# Patient Record
Sex: Male | Born: 1945 | Marital: Married | Smoking: Former smoker
Health system: Southern US, Community
[De-identification: ages and names within clinical notes are randomized; demographics above are authoritative.]

## PROBLEM LIST (undated history)

## (undated) DIAGNOSIS — I10 Essential (primary) hypertension: Secondary | ICD-10-CM

## (undated) DIAGNOSIS — K449 Diaphragmatic hernia without obstruction or gangrene: Secondary | ICD-10-CM

## (undated) DIAGNOSIS — E669 Obesity, unspecified: Secondary | ICD-10-CM

## (undated) DIAGNOSIS — R270 Ataxia, unspecified: Secondary | ICD-10-CM

## (undated) DIAGNOSIS — F4310 Post-traumatic stress disorder, unspecified: Secondary | ICD-10-CM

## (undated) DIAGNOSIS — E785 Hyperlipidemia, unspecified: Secondary | ICD-10-CM

## (undated) DIAGNOSIS — K219 Gastro-esophageal reflux disease without esophagitis: Secondary | ICD-10-CM

## (undated) HISTORY — DX: Ataxia, unspecified: R27.0

## (undated) HISTORY — PX: HAND SURGERY: SHX662

## (undated) HISTORY — DX: Essential (primary) hypertension: I10

## (undated) HISTORY — DX: Gastro-esophageal reflux disease without esophagitis: K21.9

## (undated) HISTORY — PX: OTHER SURGICAL HISTORY: SHX169

## (undated) HISTORY — DX: Post-traumatic stress disorder, unspecified: F43.10

## (undated) HISTORY — PX: LEG SURGERY: SHX1003

## (undated) HISTORY — DX: Hyperlipidemia, unspecified: E78.5

## (undated) HISTORY — DX: Diaphragmatic hernia without obstruction or gangrene: K44.9

## (undated) HISTORY — DX: Obesity, unspecified: E66.9

## (undated) HISTORY — PX: BACK SURGERY: SHX140

---

## 2004-06-02 ENCOUNTER — Ambulatory Visit: Payer: Self-pay | Admitting: Unknown Physician Specialty

## 2005-08-23 ENCOUNTER — Ambulatory Visit: Payer: Self-pay | Admitting: Internal Medicine

## 2010-05-04 ENCOUNTER — Ambulatory Visit: Payer: Self-pay | Admitting: Otolaryngology

## 2010-05-12 ENCOUNTER — Encounter: Payer: Self-pay | Admitting: Cardiology

## 2010-05-12 ENCOUNTER — Emergency Department: Payer: Self-pay | Admitting: Emergency Medicine

## 2010-05-12 ENCOUNTER — Encounter (INDEPENDENT_AMBULATORY_CARE_PROVIDER_SITE_OTHER): Payer: Medicare Other

## 2010-05-12 ENCOUNTER — Telehealth: Payer: Self-pay | Admitting: Cardiology

## 2010-05-12 DIAGNOSIS — R0602 Shortness of breath: Secondary | ICD-10-CM

## 2010-05-17 ENCOUNTER — Ambulatory Visit (INDEPENDENT_AMBULATORY_CARE_PROVIDER_SITE_OTHER): Payer: Medicare Other | Admitting: Cardiology

## 2010-05-17 ENCOUNTER — Encounter: Payer: Self-pay | Admitting: Cardiology

## 2010-05-17 DIAGNOSIS — R42 Dizziness and giddiness: Secondary | ICD-10-CM

## 2010-05-17 DIAGNOSIS — R079 Chest pain, unspecified: Secondary | ICD-10-CM

## 2010-05-17 DIAGNOSIS — R0789 Other chest pain: Secondary | ICD-10-CM | POA: Insufficient documentation

## 2010-05-17 DIAGNOSIS — E785 Hyperlipidemia, unspecified: Secondary | ICD-10-CM | POA: Insufficient documentation

## 2010-05-18 NOTE — Progress Notes (Signed)
Summary: Sweating, shaking, heavy breathing  Phone Note Call from Patient Call back at [facility] Phone  Call back at [phone]   Caller: Wife Steward Drone) Call For: Ester Mabe Summary of Call: Pt's wife c/o the pt shaking and sweating, breathing heavy. Initial call taken by: Harlon Flor,  May 12, 2010 12:23 PM  Follow-up for Phone Call        Spoke to pt's wife, we do not have any information on pt, he has appt with Dr. Shirlee Latch on Monday 05/17/10. Pt was seeing ENT (Dr. Andee Poles) and was referred here for SOB. Pt's wife states pt has been SOB, diaphoretic, and shaking "all night." Pt is not diabetic. He denies C.P. at this time. Pt does have h/o anxiety and she states he takes medication for this but unsure of name. Advised that pt come in today for nurse visit for EKG and symptom check.  Follow-up by: Lanny Hurst RN,  May 12, 2010 12:46 PM

## 2010-05-18 NOTE — Progress Notes (Signed)
Summary: ED refusal  Phone Note Call from Patient Call back at [phone]   Caller: Wife Steward Drone) Call For: Cheikh Bramble Summary of Call: Pt was in for a nurse visit today and was advised to go the ED.  Pt's wife called stating that there is a 6 hour wait and that the pt has told her that they are going home.  I advised the pt's wife that there is nothing that we can do and that if we told him that he needs to go to the ED, then he needs to stay there.  I explained that if something happens to him at home, no one is going to be able to do anything for him.  Pt's wife would like a call back. Initial call taken by: Harlon Flor,  May 12, 2010 4:28 PM    3  Appended Document: ED refusal Patient hypotensive when he is usually hypertensive, feels "bad," was advised to go to ER given low BP.   Appended Document: ED refusal Per nurse visit append, pt was advised to go to ER, he understands our recommendation, however, he does not want to wait in ER.

## 2010-05-18 NOTE — Assessment & Plan Note (Addendum)
Summary: BP/EKG/ pt SOB/ mes  Nurse Visit   Vital Signs:  Patient profile:   65 year old male Height:      70 inches Weight:      249 pounds BMI:     35.86 Pulse rate:   96 / minute BP sitting:   88 / 62  (left arm)  Vitals Entered By: Bishop Dublin, CMA (May 12, 2010 2:56 PM)   Current Medications (verified): 1)  Omeprazole 20 Mg Cpdr (Omeprazole) .... One Tablet Once Daily 2)  Fish Oil 1000 Mg Caps (Omega-3 Fatty Acids) .... One Tablet Two Times A Day 3)  Lisinopril 20 Mg Tabs (Lisinopril) .... Takes 1/2 Tablet Once Daily 4)  Venlafaxine Hcl 150 Mg Xr24h-Cap (Venlafaxine Hcl) .... One Tablet Two Times A Day 5)  Hydrochlorothiazide 25 Mg Tabs (Hydrochlorothiazide) .... One Tablet Once Daily 6)  Aspirin 81 Mg Tbec (Aspirin) .... One Tablet Once Daily 7)  Terazosin Hcl 2 Mg Caps (Terazosin Hcl) .... Take As Directed  Allergies (verified): No Known Drug Allergies  Visit Type:  Nurse visit  CC:  c/o shoulder and arm & neck pain.  Occas. has chest pain.Marland Kitchen  Appended Document: BP/EKG/ pt SOB/ mes Pt in for nurse visit, c/o SOB, pt is diaphoretic, hypotensive BP 88/62 HR 96. Pt c/o bilateral shoulder and neck pain 6/10. Pt states he is not diabetic. He states last night he did not sleep well and symptoms began yesterday and worse this AM. Pt had f/u scheduled with Dr. Mariah Milling Monday, referred by ENT (Dr. Roney Marion). Pt with h/o hiatal hernia and was going to have GI procedure, but at ov pt was SOB and dizzy after walking up flight of stairs and Dr. Roney Marion wanted pt seen by cardiology first.  Pt states he has had Treadmill  ~2-5 years ago at the [facility] hospital he says was normal. His PCP is at the [place]. No known CAD hx. Pt currently takes Terzosin 2mg  once daily, HCTZ 25mg  once daily, and Lisinopril 10mg  once daily. He took all these meds this AM. Pt states his BP usually runs "high." Pt was sent to ER. Pt's wife later called stating pt did not want to wait at the ER so they went home. Pt and his wife  were advised that pt needed work up at ER due to hypotension and symptoms. Instructed pt to hold BP medications and to monitor BP at home and to contact office with any changes and advised to go to ER if after hours or the weekend and symptoms return.

## 2010-05-19 ENCOUNTER — Encounter: Payer: Self-pay | Admitting: Cardiovascular Disease

## 2010-05-19 ENCOUNTER — Ambulatory Visit: Payer: Self-pay | Admitting: Cardiology

## 2010-05-19 DIAGNOSIS — R079 Chest pain, unspecified: Secondary | ICD-10-CM

## 2010-05-20 ENCOUNTER — Ambulatory Visit (INDEPENDENT_AMBULATORY_CARE_PROVIDER_SITE_OTHER): Payer: Medicare Other | Admitting: *Deleted

## 2010-05-20 DIAGNOSIS — E785 Hyperlipidemia, unspecified: Secondary | ICD-10-CM

## 2010-05-20 LAB — LIPID PANEL
Cholesterol: 220 mg/dL — ABNORMAL HIGH (ref 0–200)
LDL Cholesterol: 144 mg/dL — ABNORMAL HIGH (ref 0–99)
Total CHOL/HDL Ratio: 7.1 Ratio
Triglycerides: 225 mg/dL — ABNORMAL HIGH (ref <150)
VLDL: 45 mg/dL — ABNORMAL HIGH (ref 0–40)

## 2010-05-20 LAB — HEPATIC FUNCTION PANEL
ALT: 25 U/L (ref 0–53)
Albumin: 4.6 g/dL (ref 3.5–5.2)
Indirect Bilirubin: 0.6 mg/dL (ref 0.0–0.9)
Total Protein: 7.2 g/dL (ref 6.0–8.3)

## 2010-05-21 ENCOUNTER — Telehealth: Payer: Self-pay | Admitting: *Deleted

## 2010-05-21 NOTE — Telephone Encounter (Signed)
Spoke to pt's wife, notified her that myoview was normal per Dr. Mariah Milling. Pt has echo and f/u with Dr. Shirlee Latch 05/2010.

## 2010-05-27 ENCOUNTER — Ambulatory Visit: Payer: Self-pay | Admitting: Cardiovascular Disease
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
## 2010-05-27 NOTE — Letter (Signed)
Summary: Radio broadcast assistant at [address]   [place], [place] [address]   Phone: [phone]  Fax: [phone]      [facility] MYOVIEW  Patient Name:  TRAPPER MEECH          MRN:  [id number]  DOB:  Dec 27, 1945             Weight:  244.50 Home Telephone:  [phone]           Work Phone:   Referring Physician:   __X___ Gated Myoview  _____ Lexi Scan  Your procedure is scheduled for 05/19/10 @ [facility]. Please arrive at Registration at the [facility] @ 8:15am. Instructions regarding medication: HOLD HCTZ AM prior to procedure.    PLEASE NOTIFY THE OFFICE AT LEAST 24 HOURS IN ADVANCE IF YOU ARE UNABLE TO KEEP YOUR APPOINTMENT.  [phone]   How to prepare for your Myoview test:  1)   Do not eat or drink 4 hours prior to arrival time. 2)   No caffeine or decaffeinated 12 hours prior to arrival. 3)   Your medication may be taken with water.  If your doctor stopped a        medicaiton because of this test, do not take that medication. 4)   Ladies, please do not wear dresses.  Skirts or pants are appropriate.       Please wear a short sleeve shirt. 5)   No perfume, cologne or lotion. 6)   Wear comfortable walking shoes.  No heels!    PLEASE REPORT TO [facility] MEDICAL MALL ENTRANCE. THE VOLUNTEERS AT THE FIRST DESK WILL DIRECT YOU WHERE TO GO.

## 2010-06-02 ENCOUNTER — Other Ambulatory Visit: Payer: Self-pay | Admitting: Cardiology

## 2010-06-02 DIAGNOSIS — L97509 Non-pressure chronic ulcer of other part of unspecified foot with unspecified severity: Secondary | ICD-10-CM

## 2010-06-03 ENCOUNTER — Other Ambulatory Visit (INDEPENDENT_AMBULATORY_CARE_PROVIDER_SITE_OTHER): Payer: Medicare Other | Admitting: *Deleted

## 2010-06-03 DIAGNOSIS — R072 Precordial pain: Secondary | ICD-10-CM

## 2010-06-11 ENCOUNTER — Ambulatory Visit (INDEPENDENT_AMBULATORY_CARE_PROVIDER_SITE_OTHER): Payer: Medicare Other | Admitting: Cardiology

## 2010-06-11 ENCOUNTER — Encounter: Payer: Self-pay | Admitting: Cardiology

## 2010-06-11 ENCOUNTER — Ambulatory Visit: Payer: Self-pay | Admitting: Otolaryngology

## 2010-06-11 DIAGNOSIS — R0789 Other chest pain: Secondary | ICD-10-CM

## 2010-06-11 DIAGNOSIS — R002 Palpitations: Secondary | ICD-10-CM

## 2010-06-11 DIAGNOSIS — R42 Dizziness and giddiness: Secondary | ICD-10-CM

## 2010-06-11 DIAGNOSIS — E785 Hyperlipidemia, unspecified: Secondary | ICD-10-CM

## 2010-06-11 DIAGNOSIS — R079 Chest pain, unspecified: Secondary | ICD-10-CM

## 2010-06-11 MED ORDER — SIMVASTATIN 20 MG PO TABS: 20.0000 mg | ORAL_TABLET | Freq: Every evening | ORAL | Status: DC

## 2010-06-11 NOTE — Patient Instructions (Signed)
Start Zocor 20mg  once daily. Your physician recommends that you return for a FASTING lipid profile: 2 months Your physician recommends that you schedule a follow-up appointment in: 6 months

## 2010-06-13 DIAGNOSIS — R42 Dizziness and giddiness: Secondary | ICD-10-CM | POA: Insufficient documentation

## 2010-06-13 NOTE — Assessment & Plan Note (Signed)
Patient has not had any more chest pain since last appointment.  The pain he had was worrisome, however, given his risk factors of hyperlipidemia, HTN and family history of CAD.  ETT-myoview, however, showed good exercise tolerance and no evidence for ischemia or infarction.  Continue ASA 81 mg daily.

## 2010-06-13 NOTE — Progress Notes (Signed)
Patient ID: Charles Cummings, male    DOB: February 14, 1946, 65 y.o.   MRN: [id number]  PCP: Dr. Selena Batten, [facility]  HPI  65 yo with history of HTN and hyperlipidemia presented initially for evaluation of dizziness and chest pain.  Over several weeks prior to last appointment, patient had 4-5 episodes of chest pain.  He felt tightness in his left chest radiating to the back.  This tended to occur when he first woke up in the morning.  It lasted for about 1 minute and then resolved when he took an aspirin.  He actually has not had any chest pain since the last appointment.  He has chronic dyspnea walking up steps or walking up a hill.  He additionally has been having "dizzy spells."  He has noted this especially when climbing steps: he will be lightheaded at the top.  However, lightheadedness does not always occur when he is exerting himself.  Sometimes he notes the "dizziness" when he turns his head a particular way. No tachypalpitations.    Patient does not smoke.  He does have a strong family history of premature CAD (mother in her early 71s, brother with CABG at 50).    I had him do an ETT-myoview.  He had good exercise tolerance and no evidence for ischemia or infarction.  Echo was done, showing normal LV systolic function as well as no significant valvular abnormalities.   Labs (3/12): LDL 144, HDL 31, TGs 225  Allergies (verified):  No Known Drug Allergies  Past Medical History: 1. Hyperlipidemia: rash when taking statin (not sure which one) 2. Hypertension 3. PTSD 4. Obesity 5. GERD/hiatal hernia 6. Chest pain: ETT-myoview (3/12) 9'31" exercise, no chest pain, EF 57%, no evidence for ischemia or infarction.  7. Dizziness/lightheadedness: Echo (3/12) EF 55-60%, grade I diastolic dysfunction, normal RV size and systolic function, no significant valvular abnormalities.   Family History: Father:deceased-pneumonia Mother: CAD- deceased-MI (late 79s) Brother-CABG at 25  Social  History: Retired from telephone company Married, lives in [place] Tobacco Use - Former-quit 1980 Alcohol Use - no Regular Exercise - no Drug Use - no Smoking Status:  quit Does Patient Exercise:  no Drug Use:  no  Review of Systems  All systems reviewed and negative except as per HPI.   Current Outpatient Prescriptions  Medication Sig Dispense Refill  . aspirin 81 MG tablet Take 81 mg by mouth daily.        . hydrochlorothiazide 25 MG tablet Take 25 mg by mouth daily.        Marland Kitchen lisinopril (PRINIVIL,ZESTRIL) 20 MG tablet Take 1/2 tablet daily.       . Omega-3 Fatty Acids (FISH OIL) 1000 MG CAPS Take 1,000 mg by mouth 2 (two) times daily.        Marland Kitchen omeprazole (PRILOSEC) 20 MG capsule Take 20 mg by mouth daily.        Marland Kitchen venlafaxine (EFFEXOR-XR) 150 MG 24 hr capsule Take 150 mg by mouth 2 (two) times daily.        . simvastatin (ZOCOR) 20 MG tablet Take 1 tablet (20 mg total) by mouth every evening.  30 tablet  6  . terazosin (HYTRIN) 2 MG capsule Take 2 mg by mouth at bedtime. Take as directed         Physical Exam BP 138/78  Pulse 80  Ht 5\' 10"  (1.778 m)  Wt 244 lb (110.678 kg)  BMI 35.01 kg/m2 General: NAD, overweight Neck: No JVD, no  thyromegaly or thyroid nodule.  Lungs: Clear to auscultation bilaterally with normal respiratory effort. CV: Nondisplaced PMI.  Heart regular S1/S2, no S3/S4, no murmur.  No peripheral edema.  No carotid bruit.  Normal pedal pulses.  Abdomen: Soft, nontender, no hepatosplenomegaly, no distention.  Neurologic: Alert and oriented x 3.  Psych: Normal affect. Extremities: No clubbing or cyanosis.

## 2010-06-13 NOTE — Assessment & Plan Note (Signed)
Patient's dizzy spells have some features that could be consistent with positional vertigo but do not fully fit this diagnosis.  He gets them on most days.  I will get a 48 hour holter monitor to look for any arrhythmia that could contribute.

## 2010-06-13 NOTE — Assessment & Plan Note (Signed)
LDL is high.  Given risk factors for CAD, I am going to go ahead and start him on simvastatin 20 mg daily.  Lipids/LFTs in 2 months.

## 2010-06-17 ENCOUNTER — Encounter: Payer: Self-pay | Admitting: Cardiology

## 2010-07-13 ENCOUNTER — Telehealth: Payer: Self-pay | Admitting: *Deleted

## 2010-07-13 NOTE — Telephone Encounter (Signed)
Pt's wife called stating pt had previously discussed having heart cath at [facility], but has recently changed his mind and wants it to be done here. Pt continues to have dizziness and SOB per pt's wife. Pt last seen by Dr. Shirlee Latch 06/11/10, he did have normal stress test, and dizziness thought to be possibly from vertigo. Did not see anywhere discussed about having cath, do you want me to schedule? Please advise.

## 2010-07-14 NOTE — Telephone Encounter (Signed)
Spoke to pt, notified of msg, scheduled pt for this Friday 5/18 with Dr. Shirlee Latch.

## 2010-07-14 NOTE — Telephone Encounter (Signed)
Probably should see him in the office to discuss this.  He did have a normal myoview.  He was supposed to have a holter to evaluate the dizziness, which I thought was vertigo.  If he is symptomatically more short of breath, can discuss cath.

## 2010-07-16 ENCOUNTER — Ambulatory Visit (INDEPENDENT_AMBULATORY_CARE_PROVIDER_SITE_OTHER): Payer: Medicare Other | Admitting: Cardiology

## 2010-07-16 ENCOUNTER — Encounter: Payer: Self-pay | Admitting: *Deleted

## 2010-07-16 ENCOUNTER — Encounter: Payer: Self-pay | Admitting: Cardiology

## 2010-07-16 DIAGNOSIS — I10 Essential (primary) hypertension: Secondary | ICD-10-CM

## 2010-07-16 DIAGNOSIS — R079 Chest pain, unspecified: Secondary | ICD-10-CM

## 2010-07-16 DIAGNOSIS — R0789 Other chest pain: Secondary | ICD-10-CM

## 2010-07-16 DIAGNOSIS — Z01811 Encounter for preprocedural respiratory examination: Secondary | ICD-10-CM

## 2010-07-16 DIAGNOSIS — E7800 Pure hypercholesterolemia, unspecified: Secondary | ICD-10-CM

## 2010-07-16 DIAGNOSIS — E785 Hyperlipidemia, unspecified: Secondary | ICD-10-CM

## 2010-07-16 DIAGNOSIS — Z01818 Encounter for other preprocedural examination: Secondary | ICD-10-CM

## 2010-07-16 LAB — CBC WITH DIFFERENTIAL/PLATELET
Basophils Absolute: 0.1 10*3/uL (ref 0.0–0.1)
Lymphocytes Relative: 32 % (ref 12–46)
Lymphs Abs: 2.9 10*3/uL (ref 0.7–4.0)
Neutro Abs: 5.3 10*3/uL (ref 1.7–7.7)
Neutrophils Relative %: 57 % (ref 43–77)
Platelets: 242 10*3/uL (ref 150–400)
RBC: 5.03 MIL/uL (ref 4.22–5.81)
RDW: 13.5 % (ref 11.5–15.5)
WBC: 9.2 10*3/uL (ref 4.0–10.5)

## 2010-07-16 LAB — PROTIME-INR: INR: 0.97 (ref <1.50)

## 2010-07-16 LAB — APTT: aPTT: 29 seconds (ref 24–37)

## 2010-07-16 LAB — BASIC METABOLIC PANEL
CO2: 23 mEq/L (ref 19–32)
Chloride: 103 mEq/L (ref 96–112)
Creat: 0.79 mg/dL (ref 0.40–1.50)
Sodium: 141 mEq/L (ref 135–145)

## 2010-07-16 NOTE — Patient Instructions (Signed)
Your physician has requested that you have a cardiac catheterization. Cardiac catheterization is used to diagnose and/or treat various heart conditions. Doctors may recommend this procedure for a number of different reasons. The most common reason is to evaluate chest pain. Chest pain can be a symptom of coronary artery disease (CAD), and cardiac catheterization can show whether plaque is narrowing or blocking your heart's arteries. This procedure is also used to evaluate the valves, as well as measure the blood flow and oxygen levels in different parts of your heart. For further information please visit [email or web address] Please follow instruction sheet, as given.  Your physician recommends that you return for a FASTING lipid profile: Lipid/Lft in June 2012:

## 2010-07-18 DIAGNOSIS — I10 Essential (primary) hypertension: Secondary | ICD-10-CM | POA: Insufficient documentation

## 2010-07-18 NOTE — Progress Notes (Signed)
PCP: Dr. Selena Batten, [facility]   65 yo with history of HTN and hyperlipidemia presented initially for evaluation of dizziness and chest pain. Over several weeks prior to initial appointment, patient had 4-5 episodes of chest pain. He felt tightness in his left chest radiating to the back. This tended to occur when he first woke up in the morning. It lasted for about 1 minute and then resolved when he took an aspirin.  He has chronic dyspnea walking up steps or walking up a hill. He additionally has been having "dizzy spells." He has noted this especially when climbing steps: he will be lightheaded at the top. However, lightheadedness does not always occur when he is exerting himself. Sometimes he notes the "dizziness" when he turns his head a particular way. No tachypalpitations.   Patient does not smoke. He does have a strong family history of premature CAD (mother in her early 43s, brother with CABG at 25). I had him do an ETT-myoview. He had good exercise tolerance and no evidence for ischemia or infarction. Echo was done, showing normal LV systolic function as well as no significant valvular abnormalities.   Since last appointment, patient had another episode of severe chest pain 10-14 days ago while walking around in his house.  Pain radiated to his neck. It lasted about 30 minutes.  He went to the [facility] for evaluation, but chest pain had resolved by the time he got there.  His ECG was unremarkable and cardiac enzymes were negative.  He has not had any further chest pain but continues to get dyspnea with heavier exertion like yardwork and climbing steps.  He still notes "dizziness" with exertion.  BP is 100/80 today and has been running on the low side at home.   Holter monitor was done given history of dizziness.  This was relatively unremarkable, showing only PACs.   Labs (3/12): LDL 144, HDL 31, TGs 225   ECG: NSR, normal  Allergies (verified):  No Known Drug Allergies   Past Medical History:    1. Hyperlipidemia: rash when taking statin (not sure which one)  2. Hypertension  3. PTSD  4. Obesity  5. GERD/hiatal hernia  6. Chest pain: ETT-myoview (3/12) 9'31" exercise, no chest pain, EF 57%, no evidence for ischemia or infarction.  7. Dizziness/lightheadedness: Echo (3/12) EF 55-60%, grade I diastolic dysfunction, normal RV size and systolic function, no significant valvular abnormalities.  Holter (5/12): HR 68-115 sinus, average 82, no significant arrhythmias, occasional PACs.   Family History:  Father:deceased-pneumonia  Mother: CAD- deceased-MI (late 6s)  Brother-CABG at 68   Social History:  Retired from telephone company  Married, lives in [place]  Tobacco Use - Former-quit 1980  Alcohol Use - no  Regular Exercise - no  Drug Use - no  Smoking Status: quit  Does Patient Exercise: no  Drug Use: no   Review of Systems:  All systems reviewed and negative except as per HPI.   Current Outpatient Prescriptions  Medication Sig Dispense Refill  . aspirin 81 MG tablet Take 81 mg by mouth daily.        . hydrochlorothiazide 25 MG tablet Take 25 mg by mouth daily.        Marland Kitchen lisinopril (PRINIVIL,ZESTRIL) 20 MG tablet Take 1/2 tablet daily.       . Omega-3 Fatty Acids (FISH OIL) 1000 MG CAPS Take 1,000 mg by mouth 2 (two) times daily.        Marland Kitchen omeprazole (PRILOSEC) 20 MG  capsule Take 20 mg by mouth daily.        . simvastatin (ZOCOR) 20 MG tablet Take 1 tablet (20 mg total) by mouth every evening.  30 tablet  6  . terazosin (HYTRIN) 2 MG capsule Take 2 mg by mouth at bedtime. Take as directed       . venlafaxine (EFFEXOR-XR) 150 MG 24 hr capsule Take 150 mg by mouth 2 (two) times daily.          BP 100/80  Pulse 78  Ht 5\' 10"  (1.778 m)  Wt 245 lb (111.131 kg)  BMI 35.15 kg/m2 General: NAD, overweight  Neck: No JVD, no thyromegaly or thyroid nodule.  Lungs: Clear to auscultation bilaterally with normal respiratory effort.  CV: Nondisplaced PMI. Heart regular S1/S2,  no S3/S4, no murmur. No peripheral edema. No carotid bruit. Normal pedal pulses.  Abdomen: Soft, nontender, no hepatosplenomegaly, no distention.  Neurologic: Alert and oriented x 3.  Psych: Normal affect.  Extremities: No clubbing or cyanosis.

## 2010-07-18 NOTE — Assessment & Plan Note (Signed)
BP is running on the low side.  I will have him stop HCTZ.  We will follow his BP closely.

## 2010-07-18 NOTE — Assessment & Plan Note (Addendum)
Patient continues to have episodes of significant chest pain as well as exertional dyspnea.  He has multiple CAD risk factors.  Even though he has had a normal myoview, his ongoing symptoms are worrisome.  He went to the ER at the [place] with chest pain about 2 weeks ago.  We had a long discussion about further steps and settled on left heart catheterization, which will be done next.  We discussed the procedure and risks (02/998 chance for severe complications).  Patient will continue ASA, simvastatin, ACEI.   He is also getting an EGD at [facility] to workup for possible GI causes of his symptoms.

## 2010-07-18 NOTE — Assessment & Plan Note (Signed)
Lipids/LFTs on simvastatin in 6/12.  If CAD is found on cath, goal LDL will be < 70.

## 2010-07-20 ENCOUNTER — Ambulatory Visit (HOSPITAL_BASED_OUTPATIENT_CLINIC_OR_DEPARTMENT_OTHER)
Admission: RE | Admit: 2010-07-20 | Discharge: 2010-07-20 | Disposition: A | Discharge status: Home / Self Care | Payer: Medicare Other | Source: Ambulatory Visit | Attending: Cardiology | Admitting: Cardiology

## 2010-07-20 DIAGNOSIS — K219 Gastro-esophageal reflux disease without esophagitis: Secondary | ICD-10-CM | POA: Insufficient documentation

## 2010-07-20 DIAGNOSIS — Z8249 Family history of ischemic heart disease and other diseases of the circulatory system: Secondary | ICD-10-CM | POA: Insufficient documentation

## 2010-07-20 DIAGNOSIS — I2510 Atherosclerotic heart disease of native coronary artery without angina pectoris: Secondary | ICD-10-CM

## 2010-07-20 DIAGNOSIS — Z7982 Long term (current) use of aspirin: Secondary | ICD-10-CM | POA: Insufficient documentation

## 2010-07-20 DIAGNOSIS — R079 Chest pain, unspecified: Secondary | ICD-10-CM | POA: Insufficient documentation

## 2010-07-20 DIAGNOSIS — Z79899 Other long term (current) drug therapy: Secondary | ICD-10-CM | POA: Insufficient documentation

## 2010-07-20 DIAGNOSIS — E785 Hyperlipidemia, unspecified: Secondary | ICD-10-CM | POA: Insufficient documentation

## 2010-07-20 DIAGNOSIS — I10 Essential (primary) hypertension: Secondary | ICD-10-CM | POA: Insufficient documentation

## 2010-07-26 NOTE — Cardiovascular Report (Signed)
  NAME:  AIKEEM, LILLEY NO.:  [id number]  MEDICAL RECORD NO.:  [id number]           PATIENT TYPE:  O  LOCATION:  [facility]                         FACILITY:  [facility]  PHYSICIAN:  Marca Ancona, MD      DATE OF BIRTH:  [birth year]  DATE OF PROCEDURE:  07/20/2010 DATE OF DISCHARGE:                           CARDIAC CATHETERIZATION   PROCEDURES: 1. Left heart catheterization. 2. Coronary angiography.  INDICATIONS:  This is a 65 year old who has had ongoing chest pain episodes despite having a negative Myoview.  He was in the emergency department in [place], [place], with chest pain a week or 2 ago and at that time they suggested he have a heart catheterization done.  I thought this is reasonable, so I am taking him today for outpatient left heart catheterization.  PROCEDURE NOTE:  After informed consent was obtained, the patient underwent Allen testing on his right wrist.  The right ulnar artery provided good collateral circulation to the radial side of the hand, constituting a positive test.  The right radial area was sterilely prepped and draped.  A 1% lidocaine was used to locally anesthetize the right radial area.  The right radial artery was entered using modified Seldinger technique and a 5-French arterial sheath was placed.  The patient received 3 mg of intraarterial verapamil and 4000 units of IV heparin.  The right coronary artery was then engaged using the JR-4 catheter.  The left coronary artery was engaged using the JL-3.5 catheter and the left ventricle was entered using angled pigtail catheter.  There were no complications.  FINDINGS: 1. Hemodynamics:  LV 135/25, aorta 139/80. 2. Left ventriculography.  Left ventriculography was not done.  The     patient has had recent echo and Myoview, both showing normal     systolic function and wall motion. 3. Right coronary artery.  The right coronary artery was dominant     vessel with luminal irregularities only. 4.  Left main.  The left main had no significant disease. 5. Left circumflex system.  There was a large ramus intermedius with     luminal irregularities.  The circumflex itself was relatively small     and gave rise to a small first obtuse marginal.  There were luminal     irregularities only. 6. LAD system.  There was a small-to-moderate first diagonal with     about 30% proximal stenosis.  The proximal LAD itself at the site     of the first diagonal had about 30% stenosis.  The remainder of the     LAD had mild luminal irregularities.  IMPRESSION:  The patient had nonobstructive coronary artery disease.  I do question whether his symptoms could be gastrointestinal related.  He will set up for an EGD on Thursday.     Marca Ancona, MD     DM/MEDQ  D:  07/20/2010  T:  07/20/2010  Job:  [id number]  cc:   Dr. Selena Batten  Electronically Signed by Marca Ancona MD on 07/26/2010 08:35:14 AM

## 2010-07-29 ENCOUNTER — Ambulatory Visit: Payer: Self-pay | Admitting: Gastroenterology

## 2010-07-29 ENCOUNTER — Encounter (INDEPENDENT_AMBULATORY_CARE_PROVIDER_SITE_OTHER): Payer: Medicare Other | Admitting: *Deleted

## 2010-07-29 ENCOUNTER — Other Ambulatory Visit: Payer: Self-pay | Admitting: Cardiology

## 2010-07-29 DIAGNOSIS — R42 Dizziness and giddiness: Secondary | ICD-10-CM

## 2010-07-29 DIAGNOSIS — R0789 Other chest pain: Secondary | ICD-10-CM

## 2010-08-02 ENCOUNTER — Encounter: Payer: Self-pay | Admitting: Cardiology

## 2010-08-03 ENCOUNTER — Other Ambulatory Visit (INDEPENDENT_AMBULATORY_CARE_PROVIDER_SITE_OTHER): Payer: Medicare Other | Admitting: *Deleted

## 2010-08-03 DIAGNOSIS — E7800 Pure hypercholesterolemia, unspecified: Secondary | ICD-10-CM

## 2010-08-03 LAB — HEPATIC FUNCTION PANEL
ALT: 20 U/L (ref 0–53)
AST: 20 U/L (ref 0–37)
Albumin: 4.6 g/dL (ref 3.5–5.2)
Alkaline Phosphatase: 58 U/L (ref 39–117)
Bilirubin, Direct: 0.1 mg/dL (ref 0.0–0.3)
Total Bilirubin: 0.5 mg/dL (ref 0.3–1.2)

## 2010-08-03 LAB — LIPID PANEL
Cholesterol: 166 mg/dL (ref 0–200)
HDL: 33 mg/dL — ABNORMAL LOW (ref >39)
Total CHOL/HDL Ratio: 5 Ratio

## 2011-05-11 ENCOUNTER — Ambulatory Visit: Payer: Self-pay | Admitting: Urology

## 2011-05-11 LAB — HEMOGLOBIN: HGB: 15.9 g/dL (ref 13.0–18.0)

## 2011-05-11 LAB — POTASSIUM: Potassium: 4.2 mmol/L (ref 3.5–5.1)

## 2011-05-12 LAB — URINE CULTURE

## 2011-05-18 ENCOUNTER — Ambulatory Visit: Payer: Self-pay | Admitting: Urology

## 2011-09-26 ENCOUNTER — Ambulatory Visit: Payer: Self-pay | Admitting: Otolaryngology

## 2011-09-26 LAB — CREATININE, SERUM: EGFR (Non-African Amer.): >60

## 2012-08-13 ENCOUNTER — Encounter: Payer: Self-pay | Admitting: *Deleted

## 2012-08-14 ENCOUNTER — Encounter: Payer: Self-pay | Admitting: Cardiology

## 2012-08-14 ENCOUNTER — Ambulatory Visit (INDEPENDENT_AMBULATORY_CARE_PROVIDER_SITE_OTHER): Payer: Medicare Other | Admitting: Cardiology

## 2012-08-14 VITALS — BP 128/72 | HR 83 | Ht 70.0 in | Wt 248.0 lb

## 2012-08-14 DIAGNOSIS — R42 Dizziness and giddiness: Secondary | ICD-10-CM

## 2012-08-14 DIAGNOSIS — E785 Hyperlipidemia, unspecified: Secondary | ICD-10-CM

## 2012-08-14 DIAGNOSIS — R0789 Other chest pain: Secondary | ICD-10-CM

## 2012-08-14 DIAGNOSIS — I10 Essential (primary) hypertension: Secondary | ICD-10-CM

## 2012-08-14 NOTE — Patient Instructions (Addendum)
Your physician has requested that you have an echocardiogram. Echocardiography is a painless test that uses sound waves to create images of your heart. It provides your doctor with information about the size and shape of your heart and how well your heart's chambers and valves are working. This procedure takes approximately one hour. There are no restrictions for this procedure.  Your physician recommends that you return for a FASTING lipid profile when you have the echocardiogram.    Your physician wants you to follow-up in: 1 year with Dr Shirlee Latch. (June 2015).  You will receive a reminder letter in the mail two months in advance. If you don't receive a letter, please call our office to schedule the follow-up appointment.

## 2012-08-15 ENCOUNTER — Other Ambulatory Visit (HOSPITAL_COMMUNITY): Payer: Medicare Other

## 2012-08-16 ENCOUNTER — Other Ambulatory Visit (HOSPITAL_COMMUNITY): Payer: Self-pay | Admitting: Cardiology

## 2012-08-16 ENCOUNTER — Other Ambulatory Visit (INDEPENDENT_AMBULATORY_CARE_PROVIDER_SITE_OTHER): Payer: Medicare Other

## 2012-08-16 ENCOUNTER — Ambulatory Visit (HOSPITAL_COMMUNITY): Payer: Medicare Other | Attending: Cardiology | Admitting: Radiology

## 2012-08-16 DIAGNOSIS — R42 Dizziness and giddiness: Secondary | ICD-10-CM | POA: Insufficient documentation

## 2012-08-16 DIAGNOSIS — E785 Hyperlipidemia, unspecified: Secondary | ICD-10-CM

## 2012-08-16 DIAGNOSIS — R0602 Shortness of breath: Secondary | ICD-10-CM

## 2012-08-16 LAB — LIPID PANEL
Cholesterol: 191 mg/dL (ref 0–200)
HDL: 28 mg/dL — ABNORMAL LOW (ref >39.00)
Total CHOL/HDL Ratio: 7
Triglycerides: 282 mg/dL — ABNORMAL HIGH (ref 0.0–149.0)
VLDL: 56.4 mg/dL — ABNORMAL HIGH (ref 0.0–40.0)

## 2012-08-16 LAB — LDL CHOLESTEROL, DIRECT: Direct LDL: 115.6 mg/dL

## 2012-08-16 MED ORDER — PERFLUTREN PROTEIN A MICROSPH IV SUSP
3.0000 mL | Freq: Once | INTRAVENOUS | Status: AC
Start: 2012-08-16 — End: 2012-08-16
  Administered 2012-08-16: 3 mL via INTRAVENOUS

## 2012-08-16 NOTE — Addendum Note (Signed)
Addended by: Micki Riley C on: 08/16/2012 11:47 AM   Modules accepted: Orders

## 2012-08-16 NOTE — Progress Notes (Signed)
Patient ID: Charles Cummings, male   DOB: [birth year], 67 y.o.   MRN: [id number] PCP: Dr. Selena Batten, [facility]   67 yo with history of HTN and hyperlipidemia returns to the office for preoperative cardiac evaluation.  He needs TKR (next Tuesday).  Patient does not smoke. He does have a strong family history of premature CAD (mother in her early 30s, brother with CABG at 22). I had him do an ETT-myoview in 3/12. He had good exercise tolerance and no evidence for ischemia or infarction. Echo was done in 3/12, showing normal LV systolic function as well as no significant valvular abnormalities.  He continued to have chest pain episodes so had LHC in 5/12 that showed nonobstructive CAD.   He had one episode of lightheadedness recently with urinating => felt warm, then lightheaded.  He is having to strain to urinate due to BPH.  He has had lightheaded spells with straining to urinate in the past as well.  No chest pain.  Main limitation at this point is knee pain.  He can go up a flight of steps but it is difficult because of the knee pain.  No exertional dyspnea (though not very active).  No tachypalpitations.   Labs (3/12): LDL 144, HDL 31, TGs 225   ECG: NSR, normal  Allergies (verified):  No Known Drug Allergies   Past Medical History:  1. Hyperlipidemia: rash when taking statin (not sure which one)  2. Hypertension  3. PTSD  4. Obesity  5. GERD/hiatal hernia  6. Chest pain: ETT-myoview (3/12) 9'31" exercise, no chest pain, EF 57%, no evidence for ischemia or infarction.  LHC (5/12) with nonobstructive disease.  7. Dizziness/lightheadedness: Echo (3/12) EF 55-60%, grade I diastolic dysfunction, normal RV size and systolic function, no significant valvular abnormalities.  Holter (5/12): HR 68-115 sinus, average 82, no significant arrhythmias, occasional PACs.  8. Osteoarthritis  Family History:  Father:deceased-pneumonia  Mother: CAD- deceased-MI (late 7s)  Brother-CABG at 55   Social  History:  Retired from telephone company  Married, lives in [place]  Tobacco Use - Former-quit 1980  Alcohol Use - no  Regular Exercise - no  Drug Use - no  Smoking Status: quit  Does Patient Exercise: no  Drug Use: no   Review of Systems:  All systems reviewed and negative except as per HPI.   Current Outpatient Prescriptions  Medication Sig Dispense Refill  . aspirin 81 MG tablet Take 81 mg by mouth daily.        . hydrochlorothiazide 25 MG tablet Take 25 mg by mouth daily.        Marland Kitchen lisinopril (PRINIVIL,ZESTRIL) 20 MG tablet Take 1/2 tablet daily.       . Omega-3 Fatty Acids (FISH OIL) 1000 MG CAPS Take 1,000 mg by mouth 2 (two) times daily.        Marland Kitchen omeprazole (PRILOSEC) 20 MG capsule Take 20 mg by mouth daily.        . simvastatin (ZOCOR) 20 MG tablet Take 1 tablet (20 mg total) by mouth every evening.  30 tablet  6  . terazosin (HYTRIN) 2 MG capsule Take 2 mg by mouth at bedtime. Take as directed       . venlafaxine (EFFEXOR-XR) 150 MG 24 hr capsule Take 150 mg by mouth 2 (two) times daily.         No current facility-administered medications for this visit.    BP 128/72  Pulse 83  Ht 5\' 10"  (1.778 m)  Wt 248 lb (112.492 kg)  BMI 35.58 kg/m2 General: NAD, overweight  Neck: No JVD, no thyromegaly or thyroid nodule.  Lungs: Clear to auscultation bilaterally with normal respiratory effort.  CV: Nondisplaced PMI. Heart regular S1/S2, no S3/S4, no murmur. No peripheral edema. No carotid bruit. Normal pedal pulses.  Abdomen: Soft, nontender, no hepatosplenomegaly, no distention.  Neurologic: Alert and oriented x 3.  Psych: Normal affect.  Extremities: No clubbing or cyanosis.  Assessment/Plan: 1. Chest pain: He has not had significant chest pain recently.  LHC in 5/12 showed nonobstructive CAD.  He is on ASA 81 and statin.  2. Presyncope: Patient has had periodic presyncopal spells when he strains to urinate.  I suspect that these are vasovagal responses.  As he is  planning to have knee surgery soon, I will get an echocardiogram to make sure that heart remains structurally normal.  Ultimately, the treatment for this is going to be to correct his BPH so he does not strain with urination.  3. Hyperlipidemia: Check lipids today.  4. Preoperative evaluation: I think that patient can proceed with surgery without a stress test.  Had nonobstructive disease on LHC in 5/12.  As above, getting echo with presyncopal spells.   Marca Ancona 08/16/2012 10:01 AM

## 2012-08-16 NOTE — Progress Notes (Signed)
Echocardiogram performed with [employer].  

## 2012-08-20 ENCOUNTER — Other Ambulatory Visit: Payer: Self-pay | Admitting: *Deleted

## 2012-08-20 DIAGNOSIS — E785 Hyperlipidemia, unspecified: Secondary | ICD-10-CM

## 2012-08-20 MED ORDER — SIMVASTATIN 40 MG PO TABS: 40.0000 mg | ORAL_TABLET | Freq: Every day | ORAL | Status: AC

## 2012-10-16 ENCOUNTER — Other Ambulatory Visit (INDEPENDENT_AMBULATORY_CARE_PROVIDER_SITE_OTHER): Payer: Medicare Other

## 2012-10-16 DIAGNOSIS — E785 Hyperlipidemia, unspecified: Secondary | ICD-10-CM

## 2012-10-16 LAB — HEPATIC FUNCTION PANEL
ALT: 14 U/L (ref 0–53)
AST: 21 U/L (ref 0–37)
Alkaline Phosphatase: 69 U/L (ref 39–117)
Bilirubin, Direct: 0 mg/dL (ref 0.0–0.3)
Total Bilirubin: 0.5 mg/dL (ref 0.3–1.2)

## 2012-10-16 LAB — LIPID PANEL: HDL: 26.7 mg/dL — ABNORMAL LOW (ref >39.00)

## 2012-10-22 ENCOUNTER — Telehealth: Payer: Self-pay

## 2012-10-22 NOTE — Telephone Encounter (Signed)
Message copied by Charline Bills on Mon Oct 22, 2012  4:42 PM ------      Message from: Laurey Morale      Created: Fri Oct 19, 2012  9:37 AM       LDL is ok ------

## 2012-10-22 NOTE — Telephone Encounter (Signed)
Message copied by Charline Bills on Mon Oct 22, 2012  4:47 PM ------      Message from: Laurey Morale      Created: Fri Oct 19, 2012  9:37 AM       LDL is ok ------

## 2012-10-22 NOTE — Telephone Encounter (Signed)
Spoke with this patient about his recent lab work and he verbalized understanding. Patient was counseled on a low cholestrol diet and to increase in exercise as he could.

## 2012-10-24 NOTE — Telephone Encounter (Signed)
Spoke with this patient about his lab work and stressed the importance of a low cholesterol diet combined with exercise.

## 2013-06-06 ENCOUNTER — Ambulatory Visit: Payer: Self-pay | Admitting: Neurology

## 2013-07-19 ENCOUNTER — Encounter: Payer: Self-pay | Admitting: Cardiology

## 2013-08-22 DIAGNOSIS — R471 Dysarthria and anarthria: Secondary | ICD-10-CM | POA: Insufficient documentation

## 2013-08-22 DIAGNOSIS — G629 Polyneuropathy, unspecified: Secondary | ICD-10-CM | POA: Insufficient documentation

## 2013-08-22 DIAGNOSIS — A5211 Tabes dorsalis: Secondary | ICD-10-CM | POA: Insufficient documentation

## 2013-08-29 ENCOUNTER — Ambulatory Visit (INDEPENDENT_AMBULATORY_CARE_PROVIDER_SITE_OTHER): Payer: Medicare Other | Admitting: Cardiology

## 2013-08-29 ENCOUNTER — Encounter: Payer: Self-pay | Admitting: *Deleted

## 2013-08-29 ENCOUNTER — Encounter: Payer: Self-pay | Admitting: Cardiology

## 2013-08-29 VITALS — BP 146/80 | HR 76 | Ht 70.0 in | Wt 244.0 lb

## 2013-08-29 DIAGNOSIS — R0989 Other specified symptoms and signs involving the circulatory and respiratory systems: Secondary | ICD-10-CM

## 2013-08-29 DIAGNOSIS — R5381 Other malaise: Secondary | ICD-10-CM

## 2013-08-29 DIAGNOSIS — R0609 Other forms of dyspnea: Secondary | ICD-10-CM

## 2013-08-29 DIAGNOSIS — E785 Hyperlipidemia, unspecified: Secondary | ICD-10-CM

## 2013-08-29 DIAGNOSIS — R5383 Other fatigue: Secondary | ICD-10-CM

## 2013-08-29 LAB — BASIC METABOLIC PANEL
BUN: 14 mg/dL (ref 6–23)
CHLORIDE: 105 meq/L (ref 96–112)
CO2: 29 meq/L (ref 19–32)
CREATININE: 0.7 mg/dL (ref 0.4–1.5)
Calcium: 10.1 mg/dL (ref 8.4–10.5)
GFR: 127.7 mL/min (ref >60.00)
GLUCOSE: 111 mg/dL — AB (ref 70–99)
Potassium: 3.8 mEq/L (ref 3.5–5.1)
Sodium: 139 mEq/L (ref 135–145)

## 2013-08-29 LAB — CBC WITH DIFFERENTIAL/PLATELET
BASOS ABS: 0 10*3/uL (ref 0.0–0.1)
BASOS PCT: 0.6 % (ref 0.0–3.0)
EOS PCT: 2 % (ref 0.0–5.0)
Eosinophils Absolute: 0.2 10*3/uL (ref 0.0–0.7)
HEMATOCRIT: 41.9 % (ref 39.0–52.0)
HEMOGLOBIN: 13.7 g/dL (ref 13.0–17.0)
LYMPHS PCT: 33.3 % (ref 12.0–46.0)
Lymphs Abs: 2.7 10*3/uL (ref 0.7–4.0)
MCHC: 32.7 g/dL (ref 30.0–36.0)
MCV: 88.7 fl (ref 78.0–100.0)
MONOS PCT: 7.9 % (ref 3.0–12.0)
Monocytes Absolute: 0.6 10*3/uL (ref 0.1–1.0)
NEUTROS ABS: 4.5 10*3/uL (ref 1.4–7.7)
Neutrophils Relative %: 56.2 % (ref 43.0–77.0)
Platelets: 270 10*3/uL (ref 150.0–400.0)
RBC: 4.72 Mil/uL (ref 4.22–5.81)
RDW: 14.3 % (ref 11.5–15.5)
WBC: 8 10*3/uL (ref 4.0–10.5)

## 2013-08-29 LAB — BRAIN NATRIURETIC PEPTIDE: Pro B Natriuretic peptide (BNP): 12 pg/mL (ref 0.0–100.0)

## 2013-08-29 LAB — LIPID PANEL
CHOL/HDL RATIO: 5
Cholesterol: 192 mg/dL (ref 0–200)
HDL: 36.5 mg/dL — ABNORMAL LOW (ref >39.00)
LDL Cholesterol: 86 mg/dL (ref 0–99)
NONHDL: 155.5
Triglycerides: 350 mg/dL — ABNORMAL HIGH (ref 0.0–149.0)
VLDL: 70 mg/dL — ABNORMAL HIGH (ref 0.0–40.0)

## 2013-08-29 LAB — TSH: TSH: 1.52 u[IU]/mL (ref 0.35–4.50)

## 2013-08-29 MED ORDER — POTASSIUM CHLORIDE ER 10 MEQ PO TBCR: 10.0000 meq | EXTENDED_RELEASE_TABLET | Freq: Every day | ORAL | Status: DC

## 2013-08-29 MED ORDER — FUROSEMIDE 20 MG PO TABS: 20.0000 mg | ORAL_TABLET | Freq: Every day | ORAL | Status: DC

## 2013-08-29 NOTE — Patient Instructions (Signed)
Start lasix 20mg  daily.  Start KCL(potassium) 10 mEq daily.  Your physician recommends that you have lab today--lipid profile/BMET/BNP/CBCd/TSH.  Your physician has requested that you have a lexiscan myoview. For further information please visit [email or web address]. Please follow instruction sheet, as given.  Your physician has requested that you have an echocardiogram. Echocardiography is a painless test that uses sound waves to create images of your heart. It provides your doctor with information about the size and shape of your heart and how well your heart's chambers and valves are working. This procedure takes approximately one hour. There are no restrictions for this procedure.  Your physician recommends that you schedule a follow-up appointment in: 2 weeks with Dr Shirlee LatchMcLean

## 2013-08-30 DIAGNOSIS — R5383 Other fatigue: Secondary | ICD-10-CM | POA: Insufficient documentation

## 2013-08-30 DIAGNOSIS — R0609 Other forms of dyspnea: Principal | ICD-10-CM

## 2013-08-30 NOTE — Progress Notes (Signed)
Patient ID: Charles Cummings, male   DOB: 03/27/1945, 68 y.o.   MRN: [id number] [facility]   68 yo with history of HTN and hyperlipidemia returns to the office for cardiology followup.  Patient does not smoke. He does have a strong family history of premature CAD (mother in her early 9060s, brother with CABG at 7850). I had him do an ETT-myoview in 3/12. He had good exercise tolerance and no evidence for ischemia or infarction. He continued to have chest pain episodes so had LHC in 5/12 that showed nonobstructive CAD.  Last echo in 6/14 showed EF 60-65% with mild LVH.  Since I last saw him, he had left TKA in 6/14.  He has been diagnosed with [facility].  He is walking now with a walker due to unsteady gait.   Patient and his wife are concerned because of increased exertional dyspnea.  He is short of breath with minimal walking (just around house).  He has a lot of difficulty trying to walk outside when it is hot.  As above, gait is very unsteady now due to progressive ataxia and he uses a walker everywhere. No chest pain.  No syncope or tachypalpitations.  He tires very easily.  He has slept in a recliner for 2-3 years.  No PND.    Labs (3/12): LDL 144, HDL 31, TGs 225  Labs (8/14): LDL 93, HDL 27  ECG: NSR, normal  Allergies (verified):  No Known Drug Allergies   Past Medical History:  1. Hyperlipidemia: rash when taking statin (not sure which one)  2. Hypertension  3. PTSD  4. Obesity  5. GERD/hiatal hernia  6. Chest pain: ETT-myoview (3/12) 9'31" exercise, no chest pain, EF 57%, no evidence for ischemia or infarction.  LHC (5/12) with nonobstructive disease.  Echo (6/14) with EF 60-65%, mild LVH.  7. Dizziness/lightheadedness:  Holter (5/12): HR 68-115 sinus, average 82, no significant arrhythmias, occasional PACs.  8. Osteoarthritis: s/p left TKR in 6/15.  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.   Family History:  Father:deceased-pneumonia  Mother:  CAD- deceased-MI (late 6360s)  Brother-CABG at 4150   Social History:  Retired from telephone company  Married, lives in [place]  Tobacco Use - Former-quit 1980  Alcohol Use - no  Regular Exercise - no  Drug Use - no  Smoking Status: quit  Does Patient Exercise: no  Drug Use: no   Review of Systems:  All systems reviewed and negative except as per HPI.   Current Outpatient Prescriptions  Medication Sig Dispense Refill  . aspirin 81 MG tablet Take 81 mg by mouth daily.        . furosemide (LASIX) 20 MG tablet Take 1 tablet (20 mg total) by mouth daily.  30 tablet  3  . hydrochlorothiazide 25 MG tablet Take 25 mg by mouth daily.        Marland Kitchen. lisinopril (PRINIVIL,ZESTRIL) 20 MG tablet Take 1/2 tablet daily.       . Omega-3 Fatty Acids (FISH OIL) 1000 MG CAPS Take 1,000 mg by mouth 2 (two) times daily.        Marland Kitchen. omeprazole (PRILOSEC) 20 MG capsule Take 20 mg by mouth daily.        . potassium chloride (K-DUR) 10 MEQ tablet Take 1 tablet (10 mEq total) by mouth daily.  30 tablet  3  . simvastatin (ZOCOR) 40 MG tablet Take 1 tablet (40 mg total) by mouth at bedtime.  30  tablet  3  . terazosin (HYTRIN) 2 MG capsule Take 2 mg by mouth at bedtime. Take as directed       . venlafaxine (EFFEXOR-XR) 150 MG 24 hr capsule Take 150 mg by mouth 2 (two) times daily.         No current facility-administered medications for this visit.    BP 146/80  Pulse 76  Ht 5\' 10"  (1.778 m)  Wt 110.678 kg (244 lb)  BMI 35.01 kg/m2 General: NAD, overweight  Neck: JVP 8 cm, no thyromegaly or thyroid nodule.  Lungs: Clear to auscultation bilaterally with normal respiratory effort.  CV: Nondisplaced PMI. Heart regular S1/S2, no S3/S4, no murmur. 1+ bilateral ankle edema. No carotid bruit. Normal pedal pulses.  Abdomen: Soft, nontender, no hepatosplenomegaly, no distention.  Neurologic: Alert and oriented x 3.  Psych: Normal affect.  Extremities: No clubbing or cyanosis.  Assessment/Plan: 1. Exertional  dyspnea:  Progressive over the last few months.  He is very limited with NYHA class III symptoms.  He has multiple CAD risk factors.  I suspect deconditioning plays a role, but cannot rule out the development of significant cardiac disease.  On exam, he does appear mildly volume overloaded.  - I will arrange for echocardiogram to assess LV and RV function and will check BNP today.  - Lexiscan Cardiolite to assess for ischemia. - Start Lasix 20 mg daily + KCl 10 daily.  - BMET in 2 wks.  2. Hyperlipidemia: Continue statin. I will check lipids today.  3. Fatigue: Workup as above.  Will also send CBC and TSH today.  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  [facility].   Marca AnconaDalton McLean 08/30/2013 10:32 AM

## 2013-09-02 ENCOUNTER — Encounter: Payer: Self-pay | Admitting: *Deleted

## 2013-09-02 ENCOUNTER — Ambulatory Visit (HOSPITAL_COMMUNITY): Payer: Medicare Other | Attending: Cardiology | Admitting: Radiology

## 2013-09-02 ENCOUNTER — Other Ambulatory Visit: Payer: Self-pay | Admitting: *Deleted

## 2013-09-02 DIAGNOSIS — Z8249 Family history of ischemic heart disease and other diseases of the circulatory system: Secondary | ICD-10-CM | POA: Insufficient documentation

## 2013-09-02 DIAGNOSIS — R0602 Shortness of breath: Secondary | ICD-10-CM | POA: Diagnosis present

## 2013-09-02 DIAGNOSIS — Z87891 Personal history of nicotine dependence: Secondary | ICD-10-CM | POA: Insufficient documentation

## 2013-09-02 DIAGNOSIS — R5381 Other malaise: Secondary | ICD-10-CM | POA: Insufficient documentation

## 2013-09-02 DIAGNOSIS — R079 Chest pain, unspecified: Secondary | ICD-10-CM | POA: Insufficient documentation

## 2013-09-02 DIAGNOSIS — R42 Dizziness and giddiness: Secondary | ICD-10-CM | POA: Diagnosis not present

## 2013-09-02 DIAGNOSIS — E785 Hyperlipidemia, unspecified: Secondary | ICD-10-CM | POA: Insufficient documentation

## 2013-09-02 DIAGNOSIS — I10 Essential (primary) hypertension: Secondary | ICD-10-CM | POA: Diagnosis not present

## 2013-09-02 DIAGNOSIS — R5383 Other fatigue: Secondary | ICD-10-CM

## 2013-09-02 DIAGNOSIS — R0609 Other forms of dyspnea: Secondary | ICD-10-CM

## 2013-09-02 NOTE — Progress Notes (Signed)
Echocardiogram performed.  

## 2013-09-04 ENCOUNTER — Telehealth: Payer: Self-pay | Admitting: Cardiology

## 2013-09-04 NOTE — Telephone Encounter (Signed)
**Note De-Identified Joshue Badal Obfuscation** The pts wife, Steward DroneBrenda, is advised of the pts echo results. She verbalized understanding and states that she will notify the pt of his results.

## 2013-09-04 NOTE — Telephone Encounter (Signed)
New message ° ° ° ° ° °Want echo results °

## 2013-09-09 ENCOUNTER — Ambulatory Visit (HOSPITAL_COMMUNITY): Payer: Medicare Other | Attending: Cardiology | Admitting: Radiology

## 2013-09-09 VITALS — BP 160/90 | HR 70 | Ht 70.0 in | Wt 250.0 lb

## 2013-09-09 DIAGNOSIS — R0609 Other forms of dyspnea: Secondary | ICD-10-CM | POA: Insufficient documentation

## 2013-09-09 DIAGNOSIS — R5383 Other fatigue: Secondary | ICD-10-CM

## 2013-09-09 DIAGNOSIS — R0602 Shortness of breath: Secondary | ICD-10-CM

## 2013-09-09 DIAGNOSIS — R5381 Other malaise: Secondary | ICD-10-CM | POA: Insufficient documentation

## 2013-09-09 DIAGNOSIS — I10 Essential (primary) hypertension: Secondary | ICD-10-CM | POA: Insufficient documentation

## 2013-09-09 DIAGNOSIS — Z87891 Personal history of nicotine dependence: Secondary | ICD-10-CM | POA: Insufficient documentation

## 2013-09-09 DIAGNOSIS — R0989 Other specified symptoms and signs involving the circulatory and respiratory systems: Principal | ICD-10-CM | POA: Insufficient documentation

## 2013-09-09 DIAGNOSIS — E785 Hyperlipidemia, unspecified: Secondary | ICD-10-CM

## 2013-09-09 DIAGNOSIS — I2510 Atherosclerotic heart disease of native coronary artery without angina pectoris: Secondary | ICD-10-CM | POA: Insufficient documentation

## 2013-09-09 MED ORDER — REGADENOSON 0.4 MG/5ML IV SOLN
0.4000 mg | Freq: Once | INTRAVENOUS | Status: AC
  Administered 2013-09-09: 0.4 mg via INTRAVENOUS

## 2013-09-09 MED ORDER — TECHNETIUM TC 99M SESTAMIBI GENERIC - CARDIOLITE
11.0000 | Freq: Once | INTRAVENOUS | Status: AC | PRN
  Administered 2013-09-09: 11 via INTRAVENOUS

## 2013-09-09 MED ORDER — TECHNETIUM TC 99M SESTAMIBI GENERIC - CARDIOLITE
33.0000 | Freq: Once | INTRAVENOUS | Status: AC | PRN
  Administered 2013-09-09: 33 via INTRAVENOUS

## 2013-09-09 NOTE — Progress Notes (Signed)
  [facility] SITE 3 NUCLEAR MED [address] [place], [place] [address] [phone]    Cardiology Nuclear Med Study  Charles ButtsWade Kem BoroughsDouglas Cummings is a 68 y.o. male     MRN : [id number]     DOB: 06/19/1945  Procedure Date: 09/09/2013  Nuclear Med Background Indication for Stress Test:  Evaluation for Ischemia History:  CAD, Cath 2012 n/o dz, Echo 2015 EF 60-65%, MPI 2012 (normal) EF 57% Cardiac Risk Factors: Family History - CAD, History of Smoking, Hypertension and Lipids  Symptoms:  DOE and Fatigue   Nuclear Pre-Procedure Caffeine/Decaff Intake:  8:30pm NPO After: 7:00pm   Lungs:  clear O2 Sat: 98% on room air. IV 0.9% NS with Angio Cath:  22g  IV Site: R Hand  IV Started by:  Cathlyn Parsonsynthia Hasspacher, RN  Chest Size (in):  48 Cup Size: n/a  Height: 5\' 10"  (1.778 m)  Weight:  250 lb (113.399 kg)  BMI:  Body mass index is 35.87 kg/(m^2). Tech Comments:  n/a    Nuclear Med Study 1 or 2 day study: 1 day  Stress Test Type:  Lexiscan  Reading MD: n/a  Order Authorizing Provider:  Fransico Meadowalton McLean,MD  Resting Radionuclide: Technetium 2011m Sestamibi  Resting Radionuclide Dose: 11.0 mCi   Stress Radionuclide:  Technetium 1111m Sestamibi  Stress Radionuclide Dose: 33.0 mCi           Stress Protocol Rest HR: 70 Stress HR: 72  Rest BP: 160/90 Stress BP: 101/52  Exercise Time (min): n/a METS: n/a           Dose of Adenosine (mg):  n/a Dose of Lexiscan: 0.4 mg  Dose of Atropine (mg): n/a Dose of Dobutamine: n/a mcg/kg/min (at max HR)  Stress Test Technologist: Nelson ChimesSharon Brooks, BS-ES  Nuclear Technologist:  Doyne Keelonya Yount, CNMT     Rest Procedure:  Myocardial perfusion imaging was performed at rest 45 minutes following the intravenous administration of Technetium 3511m Sestamibi. Rest ECG: NSR with non-specific ST-T wave changes  Stress Procedure:  The patient received IV Lexiscan 0.4 mg over 15-seconds.  Technetium 3311m Sestamibi injected at 30-seconds.  Quantitative spect images  were obtained after a 45 minute delay.  During the infusion of Lexiscan the patient became lightheaded and had abdominal pain.  These symptoms began to resolve in recovery.  Stress ECG: No significant change from baseline ECG  QPS Raw Data Images:  Normal; no motion artifact; normal heart/lung ratio. Stress Images:  There is decreased uptake in the inferior wall. Rest Images:  Comparison with the stress images reveals no significant change. Subtraction (SDS):  There is a fixed inferior defect that is most consistent with diaphragmatic attenuation. Transient Ischemic Dilatation (Normal <1.22):  1.03 Lung/Heart Ratio (Normal <0.45):  0.28  Quantitative Gated Spect Images QGS EDV:  121 ml QGS ESV:  53 ml  Impression Exercise Capacity:  Lexiscan with no exercise. BP Response:  Normal blood pressure response. Clinical Symptoms:  No significant symptoms noted. ECG Impression:  No significant ECG changes with Lexiscan. Comparison with Prior Nuclear Study: No previous nuclear study performed  Overall Impression:  Low risk stress nuclear study with diaphragmatic attenuation artifact..  LV Ejection Fraction: 56%.  LV Wall Motion:  NL LV Function; NL Wall Motion  Thurmon FairMihai Massa Pe, MD, [facility] [phone] office [phone] pager

## 2013-09-10 ENCOUNTER — Encounter: Payer: Self-pay | Admitting: Cardiology

## 2013-09-10 ENCOUNTER — Ambulatory Visit (INDEPENDENT_AMBULATORY_CARE_PROVIDER_SITE_OTHER): Payer: Medicare Other | Admitting: Cardiology

## 2013-09-10 VITALS — BP 140/80 | HR 70 | Ht 70.0 in | Wt 254.0 lb

## 2013-09-10 DIAGNOSIS — R0609 Other forms of dyspnea: Secondary | ICD-10-CM

## 2013-09-10 DIAGNOSIS — E785 Hyperlipidemia, unspecified: Secondary | ICD-10-CM

## 2013-09-10 DIAGNOSIS — R0989 Other specified symptoms and signs involving the circulatory and respiratory systems: Principal | ICD-10-CM

## 2013-09-10 LAB — BASIC METABOLIC PANEL
BUN: 15 mg/dL (ref 6–23)
CO2: 29 mEq/L (ref 19–32)
Calcium: 10.1 mg/dL (ref 8.4–10.5)
Chloride: 104 mEq/L (ref 96–112)
Creatinine, Ser: 0.7 mg/dL (ref 0.4–1.5)
GFR: 125.49 mL/min (ref >60.00)
GLUCOSE: 85 mg/dL (ref 70–99)
POTASSIUM: 3.9 meq/L (ref 3.5–5.1)
SODIUM: 140 meq/L (ref 135–145)

## 2013-09-10 NOTE — Patient Instructions (Signed)
Stop lasix.  Stop KCL(potassium).   Your physician recommends that you have  lab work today--BMET.   Your physician wants you to follow-up in: 1 year with Dr Shirlee LatchMcLean. (July 2016).  You will receive a reminder letter in the mail two months in advance. If you don't receive a letter, please call our office to schedule the follow-up appointment.

## 2013-09-11 NOTE — Progress Notes (Signed)
Patient ID: Charles Cummings, male   DOB: 1946/01/06, 68 y.o.   MRN: [id number] PCP: [facility]   68 yo with history of HTN and hyperlipidemia returns to the office for cardiology followup.  Patient does not smoke. He does have a strong family history of premature CAD (mother in her early 85s, brother with CABG at 23). I had him do an ETT-myoview in 3/12. He had good exercise tolerance and no evidence for ischemia or infarction. He continued to have chest pain episodes so had LHC in 5/12 that showed nonobstructive CAD.  Echo in 6/14 showed EF 60-65% with mild LVH.  He had left TKA in 6/14.  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  He is walking now with a walker due to unsteady gait.   I recently saw Charles Cummings because of increased exertional dyspnea.  He is short of breath with minimal walking (just around house).  He has a lot of difficulty trying to walk outside when it is hot.  As above, gait is very unsteady now due to progressive ataxia and he uses a walker everywhere. No chest pain.  No syncope or tachypalpitations.  He tires very easily.  He has slept in a recliner for 2-3 years.  No PND.    Given concern for volume overload, I started him on Lasix.  This did not help his symptoms at all.  In fact, BNP actually returned normal.  Echo (7/15) showed EF 60-65% with mildly dilated RV and normal RV systolic function.  Lexiscan Cardiolite (7/15) was a low risk study with some diaphragmatic attenuation.    Labs (3/12): LDL 144, HDL 31, TGs 225  Labs (8/14): LDL 93, HDL 27 Labs (7/15): K 3.8, creatinine 0.7, HCT 41.9, LDL 86, HDl 36, TGs 350, BNP 12, TSH normal  Allergies (verified):  No Known Drug Allergies   Past Medical History:  1. Hyperlipidemia: rash when taking statin (not sure which one)  2. Hypertension  3. PTSD  4. Obesity  5. GERD/hiatal hernia  6. Chest pain/exertional dyspnea: ETT-myoview (3/12) 9'31" exercise, no chest pain, EF 57%, no evidence  for ischemia or infarction.  LHC (5/12) with nonobstructive disease.  Echo (6/14) with EF 60-65%, mild LVH.  Echo (7/15) with EF 60-65, mildly dilated RV with normal systolic function.  Lexiscan Cardiolite (7/15) with EF 56%, diaphragmatic attenuation with no ischemia/infarction.  7. Dizziness/lightheadedness:  Holter (5/12): HR 68-115 sinus, average 82, no significant arrhythmias, occasional PACs.  8. Osteoarthritis: s/p left TKR in 6/15.  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   Family History:  Father:deceased-pneumonia  Mother: CAD- deceased-MI (late 50s)  Brother-CABG at 68   Social History:  Retired from [employer]  Married, lives in [place]  Tobacco Use - Former-quit 1980  Alcohol Use - no  Regular Exercise - no  Drug Use - no  Smoking Status: quit  Does Patient Exercise: no  Drug Use: no   Current Outpatient Prescriptions  Medication Sig Dispense Refill  . aspirin 81 MG tablet Take 81 mg by mouth daily.        . hydrochlorothiazide 25 MG tablet Take 25 mg by mouth daily.        Marland Kitchen lisinopril (PRINIVIL,ZESTRIL) 20 MG tablet Take 1/2 tablet daily.       . Omega-3 Fatty Acids (FISH OIL) 1000 MG CAPS Take 1,000 mg by mouth 2 (two) times daily.        Marland Kitchen omeprazole (PRILOSEC) 20 MG  capsule Take 20 mg by mouth daily.        . simvastatin (ZOCOR) 40 MG tablet Take 1 tablet (40 mg total) by mouth at bedtime.  30 tablet  3  . terazosin (HYTRIN) 2 MG capsule Take 2 mg by mouth at bedtime. Take as directed       . venlafaxine (EFFEXOR-XR) 150 MG 24 hr capsule Take 150 mg by mouth 2 (two) times daily.         No current facility-administered medications for this visit.    BP 140/80  Pulse 70  Ht 5\' 10"  (1.778 m)  Wt 115.214 kg (254 lb)  BMI 36.45 kg/m2  SpO2 96% General: NAD, overweight  Neck: JVP 7 cm, no thyromegaly or thyroid nodule.  Lungs: Clear to auscultation bilaterally with normal respiratory effort.  CV: Nondisplaced PMI. Heart regular S1/S2, no S3/S4, no  murmur. Trace bilateral ankle edema. No carotid bruit. Normal pedal pulses.  Abdomen: Soft, nontender, no hepatosplenomegaly, no distention.  Neurologic: Alert and oriented x 3.  Psych: Normal affect.  Extremities: No clubbing or cyanosis.  Assessment/Plan: 1. Exertional dyspnea:  BNP normal, echo with normal LV EF, and Cardiolite with no evidence for ischemia or infarction.  Empiric Lasix did not help.  I suspect that his symptoms are mainly due to deconditioning and weight gain in the setting of curtailed activity with progressive ataxia.  He is probably not significantly volume overloaded.  - He can stop Lasix and KCl.   - BMET today - Would suggest working on weight loss and getting home physical therapy.  2. Hyperlipidemia: Continue statin, good lipids 7/15.  3. Fatigue: Negative cardiac workup.  CBC and TSH unremarkable. 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  [facility].   Marca AnconaDalton Kimothy Kishimoto 09/11/2013 12:34 AM

## 2013-10-29 DEATH — deceased

## 2013-11-12 ENCOUNTER — Telehealth: Payer: Self-pay | Admitting: Cardiology

## 2013-11-12 NOTE — Telephone Encounter (Signed)
Wife Called In asking For records of Husband I let Steward Drone Know  Charles Cummings has to obtain his Medical Records, she Then Proceeds To let me know he has Passed away at [employer] I let Charles Cummings know a Surveyor, minerals has to be Drawn up From the [employer] and Brought to [employer] We Will need to Get a Signed ROI and Will Take 10-14 Days For records to be Processed and their will be a Charge For the records. Charles Cummings. I let her Know Once she has the Correct paperwork to Bring to the Office and I will help her with Getting started on the Records part.  9.15.15/km

## 2014-06-22 NOTE — Op Note (Signed)
PATIENT NAME:  Charles Cummings, [name] MR#:  [id number] DATE OF BIRTH:  Jun 12, 1945  DATE OF PROCEDURE:  05/18/2011  PREOPERATIVE DIAGNOSIS: Benign prostatic hypertrophy with bladder outlet obstruction and incomplete bladder emptying.   POSTOPERATIVE DIAGNOSIS: Benign prostatic hypertrophy with bladder outlet obstruction and incomplete bladder emptying.  PROCEDURE PERFORMED:  Photoselective vaporization of prostate.   SURGEON: Scott C. Lonna CobbStoioff, MD   ASSISTANT: None.   ANESTHESIA: General.   INDICATIONS FOR PROCEDURE: The patient is a 69 year old male with a history of benign prostatic hypertrophy, recently presented in urinary retention. He has been able to void after catheter removal; however, he has residual in the 300 mL range. A urodynamic study showed slight decreased bladder sensation. After a discussion of options, he has elected to proceed with outlet surgery.   DESCRIPTION OF PROCEDURE: The patient was taken to the Operating Room where a general anesthetic was administered via LMA. He was placed in the low lithotomy position, and his external genitalia were prepped and draped sterilely. Timeout was performed per protocol. A 21-French continuous flow laser cystoscope was lubricated and passed under direct vision. 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 The prostate showed mild lateral lobe enlargement but marked bladder neck elevation. The bladder was with mild-to-moderate trabeculation. The ureteral orifices were with normal-appearance, clear efflux, and located well away from the bladder neck. No bladder mucosal abnormalities were noted. A Moxy KTP laser fiber was placed through the cystoscope. Using a  GreenLight XPS system at a power of 90 watts, the bladder neck was vaporized working towards the verumontanum. The lateral lobes were then vaporized from the bladder neck toward the verumontanum. Total energy used was 109,726 joules. Total measured time  was 22 minutes and 45 seconds. Hemostasis was obtained with pulse laser coagulation. At the completion of the procedure, with the scope positioned at the verumontanum, the channel was opened. No bleeding was noted. The cystoscope was removed.  A 20-French Foley catheter was placed with return of light rose effluent upon irrigation. A B and O suppository was placed per rectum. He was taken to the PACU in stable condition. There were no complications. EBL was minimal.  ____________________________ Verna CzechScott C. Lonna CobbStoioff, MD scs:cbb D: 05/18/2011 15:12:50 ET T: 05/18/2011 16:53:05 ET JOB#: [id number]  cc: Lorin PicketScott C. Lonna CobbStoioff, MD, <Dictator> Riki AltesSCOTT C STOIOFF MD ELECTRONICALLY SIGNED 06/01/2011 11:56

## 2016-03-28 IMAGING — MR MRI HEAD WITHOUT AND WITH CONTRAST
12 series · 46 of 48 positions shown · IV contrast (20 ml Multihance)
Comparison: MR brain 09/26/2011.

CLINICAL DATA: Slurred speech. Imbalance. Difficulty walking.
Hypertension.

EXAM:
MRI HEAD WITHOUT AND WITH CONTRAST
TECHNIQUE: Multiplanar, multiecho pulse sequences of the brain and surrounding
structures were obtained without and with intravenous contrast.
CONTRAST:  MultiHance 20 mL.

[Series 2: T1 · sagittal · 5.0mm · 0.45mm/px · 2 of 30 slices shown (1 of 2)]
[im 1/30]
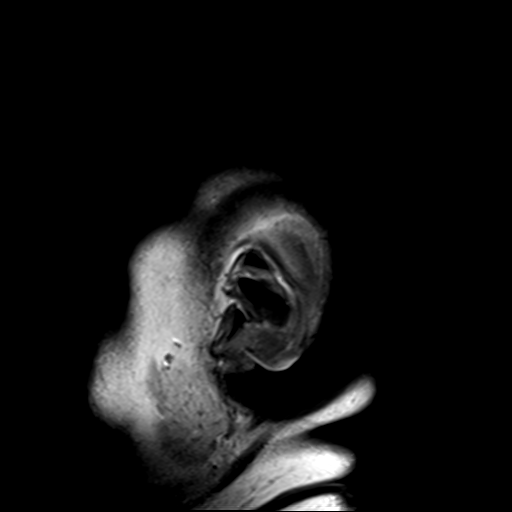
[im 30/30]
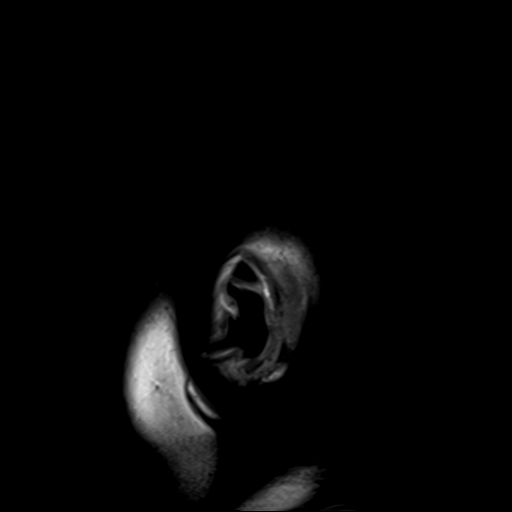

[Series 4: DWI · axial · 5.0mm · 1.80mm/px · z∈[-71,+101]mm · 2 of 28 slices shown (1 of 4)]
[im 1/28]
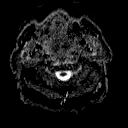
[im 28/28]
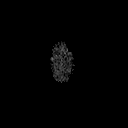

[Series 6: DWI · coronal · 5.0mm · 1.80mm/px · 3 of 41 slices shown (2 of 4)]
[im 1/41]
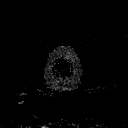
[im 21/41]
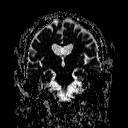
[im 41/41]
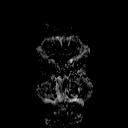

[Series 7: T2 · axial · 5.0mm · 0.60mm/px · z∈[-71,+101]mm · 3 of 28 slices shown (1 of 2)]
[im 1/28]
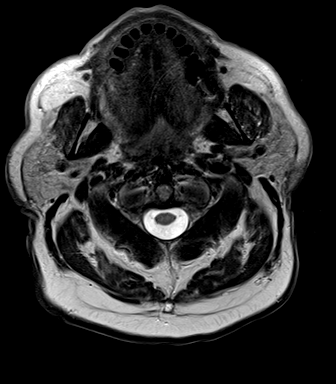
[im 14/28]
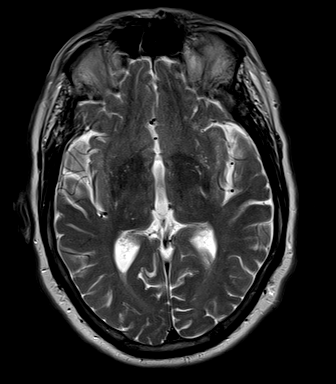
[im 28/28]
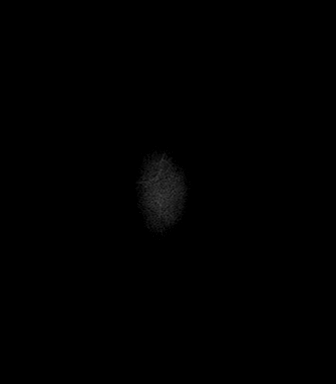

[Series 8: FLAIR · axial · 5.0mm · 0.45mm/px · z∈[-71,+101]mm · 3 of 28 slices shown]
[im 1/28]
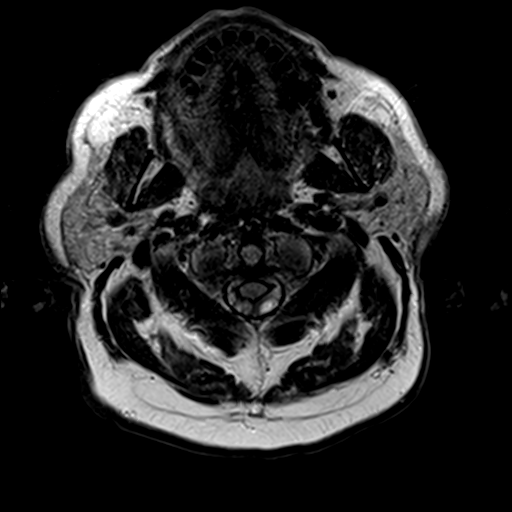
[im 14/28]
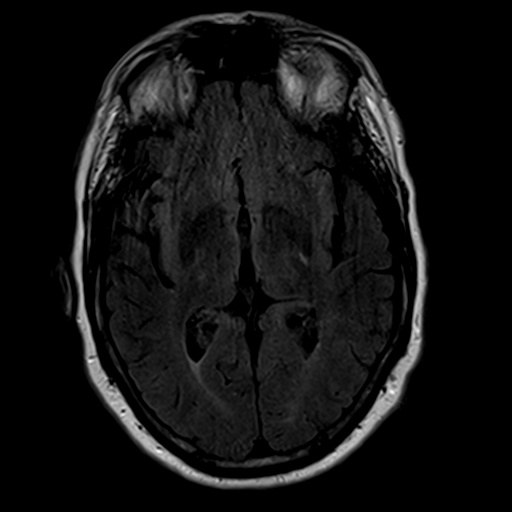
[im 28/28]
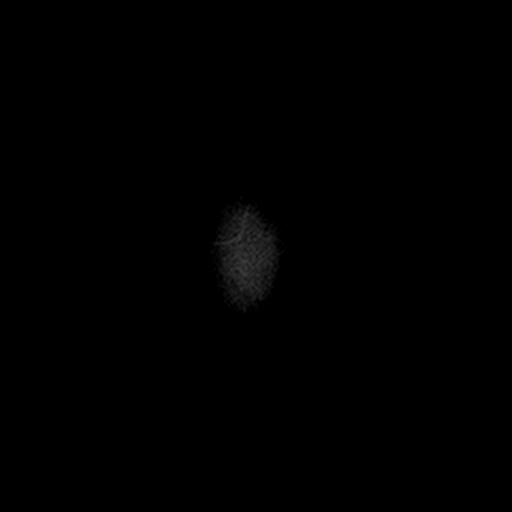

[Series 9: T2 · axial · 5.0mm · 0.45mm/px · z∈[-71,+101]mm · 3 of 28 slices shown (2 of 2)]
[im 1/28]
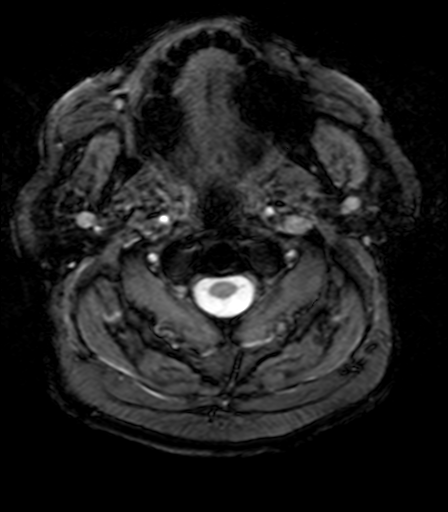
[im 14/28]
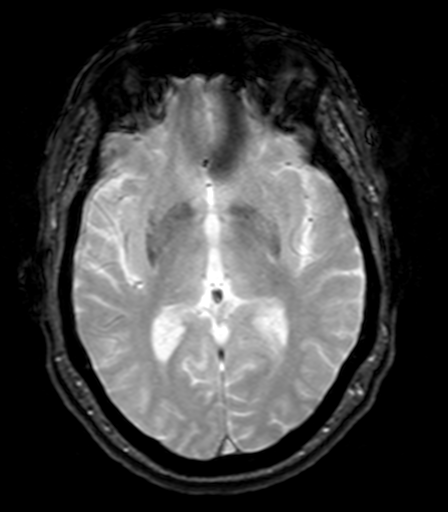
[im 28/28]
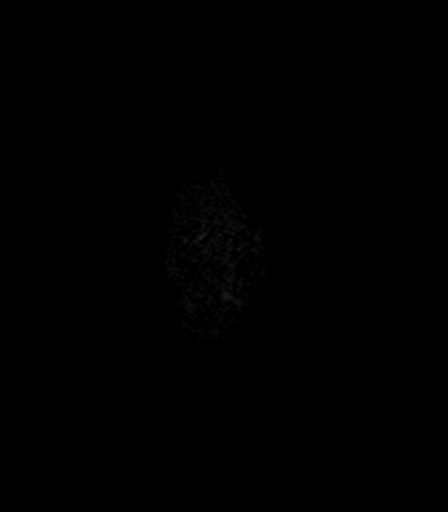

[Series 10: T1 · axial · 3.0mm · 1.00mm/px · z∈[-70,+104]mm · 5 of 60 slices shown (2 of 2)]
[im 1/60]
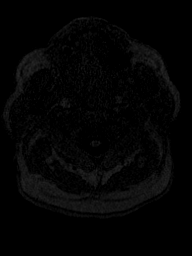
[im 15/60]
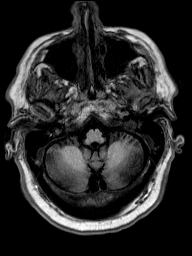
[im 30/60]
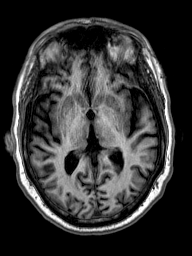
[im 45/60]
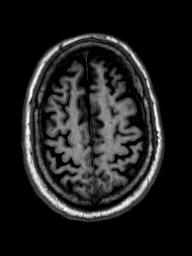
[im 60/60]
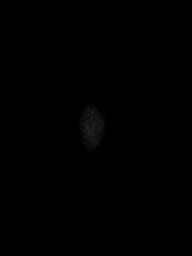

[Series 11: T2 post-contrast · coronal · 5.0mm · 0.49mm/px · 4 of 41 slices shown]
[im 1/41]
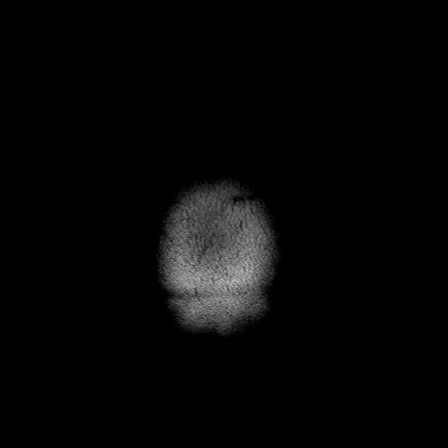
[im 14/41]
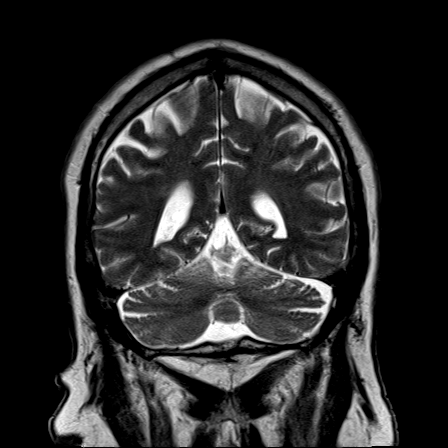
[im 27/41]
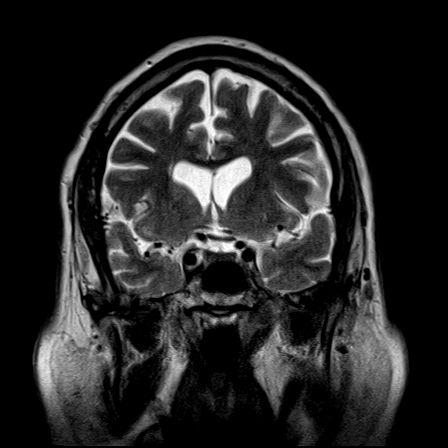
[im 41/41]
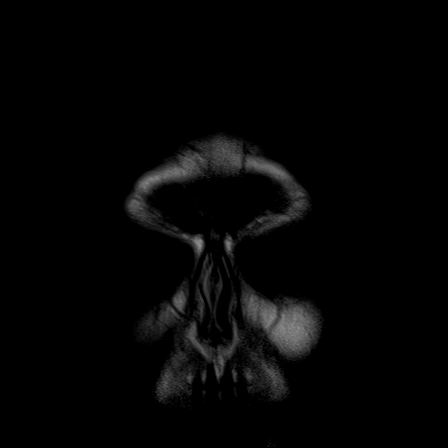

[Series 12: T1 post-contrast · axial · 3.0mm · 1.00mm/px · z∈[-70,+104]mm · 5 of 60 slices shown (1 of 2)]
[im 1/60]
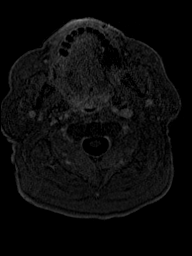
[im 15/60]
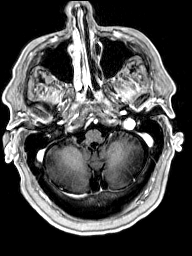
[im 30/60]
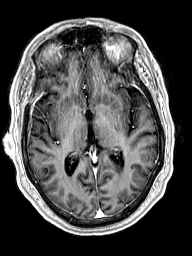
[im 45/60]
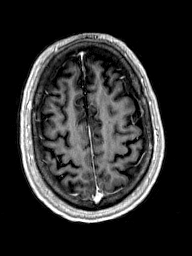
[im 60/60]
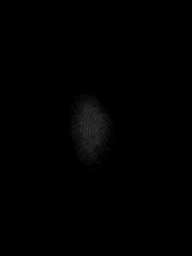

[Series 13: T1 post-contrast · coronal · 5.0mm · 0.43mm/px · 4 of 41 slices shown (2 of 2)]
[im 1/41]
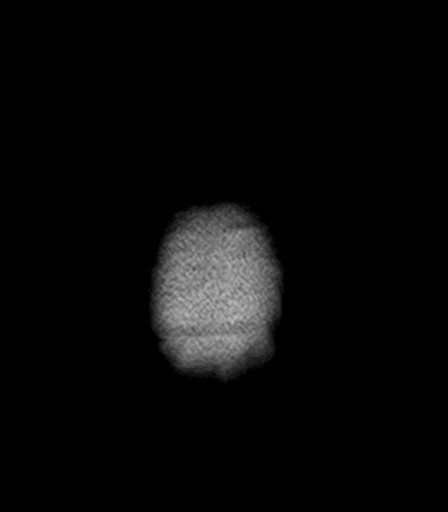
[im 14/41]
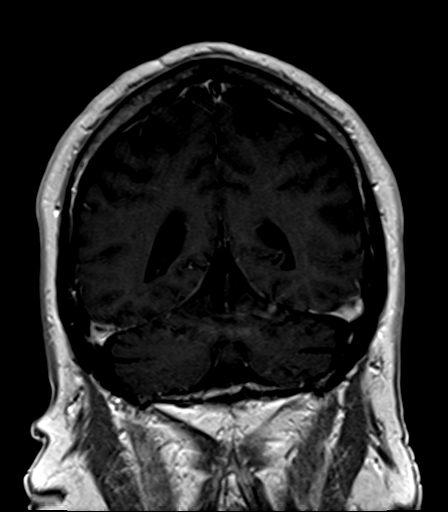
[im 27/41]
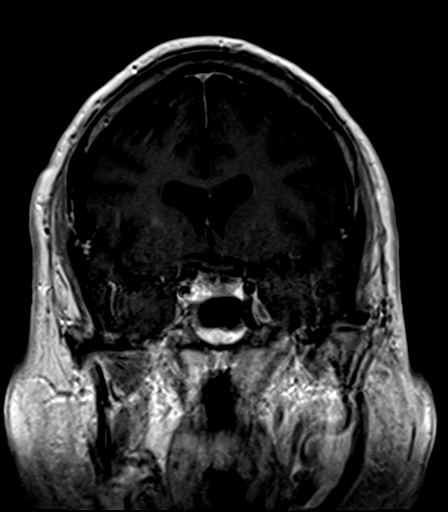
[im 41/41]
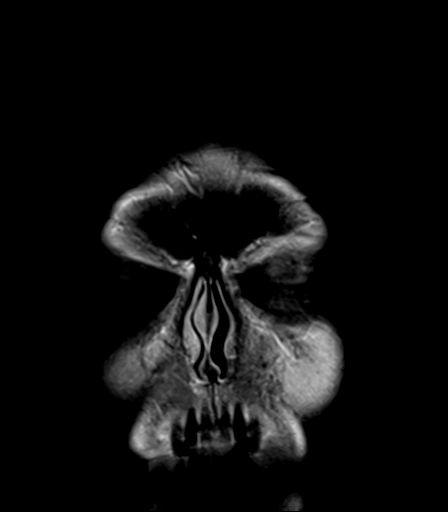

[Series 100: DWI · axial · 5.0mm · 1.80mm/px · z∈[-71,+101]mm · 3 of 28 slices shown (3 of 4)]
[im 1/28]
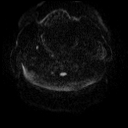
[im 14/28]
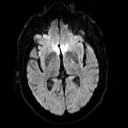
[im 28/28]
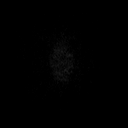

[Series 101: DWI · coronal · 5.0mm · 1.80mm/px · 9 of 122 slices shown (4 of 4)]
[im 1/122]
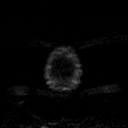
[im 13/122]
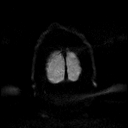
[im 25/122]
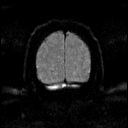
[im 37/122]
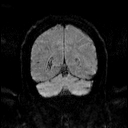
[im 49/122]
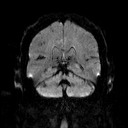
[im 73/122]
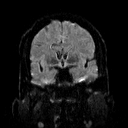
[im 85/122]
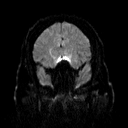
[im 97/122]
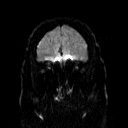
[im 122/122]
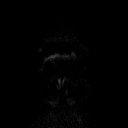

[46 of 48 positions shown; findings below may reference images not displayed]

FINDINGS: No evidence for acute infarction, hemorrhage, mass lesion,
hydrocephalus, or extra-axial fluid. Moderate cerebral and
cerebellar atrophy may be premature for the patient's age. Mild
subcortical and periventricular T2 and FLAIR hyperintensities,
likely chronic microvascular ischemic change. Flow voids are
maintained throughout the carotid, basilar, and vertebral arteries.
There are no areas of chronic hemorrhage. Pituitary, pineal, and
cerebellar tonsils unremarkable. No upper cervical lesions.
Visualized calvarium, skull base, and upper cervical osseous
structures unremarkable. Scalp and extracranial soft tissues,
orbits, sinuses, and mastoids show no acute process. Bilateral
cataract extraction.

Post infusion, no abnormal enhancement of the brain or meninges. No
significant change from priors.
IMPRESSION: Mild subcortical and periventricular T2 and FLAIR hyperintensities,
likely chronic microvascular ischemic change. No acute intracranial
findings. No abnormal postcontrast enhancement.
